# Patient Record
Sex: Female | Born: 1965
Health system: Southern US, Community
[De-identification: ages and names within clinical notes are randomized; demographics above are authoritative.]

---

## 2013-04-07 DIAGNOSIS — G4700 Insomnia, unspecified: Secondary | ICD-10-CM | POA: Insufficient documentation

## 2013-04-07 DIAGNOSIS — E890 Postprocedural hypothyroidism: Secondary | ICD-10-CM | POA: Insufficient documentation

## 2013-04-07 DIAGNOSIS — F32A Depression, unspecified: Secondary | ICD-10-CM | POA: Insufficient documentation

## 2013-04-07 DIAGNOSIS — F329 Major depressive disorder, single episode, unspecified: Secondary | ICD-10-CM | POA: Insufficient documentation

## 2013-04-07 DIAGNOSIS — L659 Nonscarring hair loss, unspecified: Secondary | ICD-10-CM | POA: Insufficient documentation

## 2013-07-18 ENCOUNTER — Ambulatory Visit: Payer: BC Managed Care – PPO | Attending: Family Medicine | Admitting: Physical Therapy

## 2014-07-18 ENCOUNTER — Other Ambulatory Visit: Payer: Self-pay

## 2014-07-18 DIAGNOSIS — Z1231 Encounter for screening mammogram for malignant neoplasm of breast: Secondary | ICD-10-CM

## 2014-08-17 ENCOUNTER — Ambulatory Visit
Admission: RE | Admit: 2014-08-17 | Discharge: 2014-08-17 | Disposition: A | Discharge status: Home / Self Care | Payer: BLUE CROSS/BLUE SHIELD | Source: Ambulatory Visit

## 2014-08-17 DIAGNOSIS — Z1231 Encounter for screening mammogram for malignant neoplasm of breast: Secondary | ICD-10-CM

## 2015-09-04 ENCOUNTER — Ambulatory Visit (HOSPITAL_COMMUNITY): Payer: 59 | Admitting: Physical Therapy

## 2016-03-20 DIAGNOSIS — Z01419 Encounter for gynecological examination (general) (routine) without abnormal findings: Secondary | ICD-10-CM | POA: Diagnosis not present

## 2016-08-13 DIAGNOSIS — M545 Low back pain: Secondary | ICD-10-CM | POA: Diagnosis not present

## 2016-08-25 DIAGNOSIS — Z1231 Encounter for screening mammogram for malignant neoplasm of breast: Secondary | ICD-10-CM | POA: Diagnosis not present

## 2016-12-05 DIAGNOSIS — F5101 Primary insomnia: Secondary | ICD-10-CM | POA: Diagnosis not present

## 2016-12-05 DIAGNOSIS — E890 Postprocedural hypothyroidism: Secondary | ICD-10-CM | POA: Diagnosis not present

## 2016-12-10 DIAGNOSIS — E890 Postprocedural hypothyroidism: Secondary | ICD-10-CM | POA: Diagnosis not present

## 2016-12-25 DIAGNOSIS — E890 Postprocedural hypothyroidism: Secondary | ICD-10-CM | POA: Diagnosis not present

## 2016-12-25 DIAGNOSIS — L659 Nonscarring hair loss, unspecified: Secondary | ICD-10-CM | POA: Diagnosis not present

## 2017-01-22 DIAGNOSIS — Z23 Encounter for immunization: Secondary | ICD-10-CM | POA: Diagnosis not present

## 2017-06-23 DIAGNOSIS — Z0000 Encounter for general adult medical examination without abnormal findings: Secondary | ICD-10-CM | POA: Diagnosis not present

## 2017-06-23 DIAGNOSIS — Z13220 Encounter for screening for lipoid disorders: Secondary | ICD-10-CM | POA: Diagnosis not present

## 2017-07-22 DIAGNOSIS — L80 Vitiligo: Secondary | ICD-10-CM | POA: Diagnosis not present

## 2017-09-16 DIAGNOSIS — Z23 Encounter for immunization: Secondary | ICD-10-CM | POA: Diagnosis not present

## 2017-09-16 DIAGNOSIS — F5101 Primary insomnia: Secondary | ICD-10-CM | POA: Diagnosis not present

## 2017-09-23 DIAGNOSIS — Z1231 Encounter for screening mammogram for malignant neoplasm of breast: Secondary | ICD-10-CM | POA: Diagnosis not present

## 2017-09-29 DIAGNOSIS — L80 Vitiligo: Secondary | ICD-10-CM | POA: Diagnosis not present

## 2017-09-29 DIAGNOSIS — L811 Chloasma: Secondary | ICD-10-CM | POA: Diagnosis not present

## 2017-09-29 DIAGNOSIS — L658 Other specified nonscarring hair loss: Secondary | ICD-10-CM | POA: Diagnosis not present

## 2017-11-23 DIAGNOSIS — M2042 Other hammer toe(s) (acquired), left foot: Secondary | ICD-10-CM | POA: Diagnosis not present

## 2017-11-23 DIAGNOSIS — M79671 Pain in right foot: Secondary | ICD-10-CM | POA: Diagnosis not present

## 2017-11-23 DIAGNOSIS — M79672 Pain in left foot: Secondary | ICD-10-CM | POA: Diagnosis not present

## 2017-12-11 DIAGNOSIS — L659 Nonscarring hair loss, unspecified: Secondary | ICD-10-CM | POA: Diagnosis not present

## 2017-12-11 DIAGNOSIS — E890 Postprocedural hypothyroidism: Secondary | ICD-10-CM | POA: Diagnosis not present

## 2017-12-18 DIAGNOSIS — E890 Postprocedural hypothyroidism: Secondary | ICD-10-CM | POA: Diagnosis not present

## 2017-12-18 DIAGNOSIS — E221 Hyperprolactinemia: Secondary | ICD-10-CM | POA: Diagnosis not present

## 2017-12-18 DIAGNOSIS — L659 Nonscarring hair loss, unspecified: Secondary | ICD-10-CM | POA: Diagnosis not present

## 2017-12-22 ENCOUNTER — Other Ambulatory Visit: Payer: Self-pay | Admitting: Endocrinology

## 2017-12-22 DIAGNOSIS — E221 Hyperprolactinemia: Secondary | ICD-10-CM

## 2018-01-05 ENCOUNTER — Ambulatory Visit
Admission: RE | Admit: 2018-01-05 | Discharge: 2018-01-05 | Disposition: A | Discharge status: Home / Self Care | Payer: BLUE CROSS/BLUE SHIELD | Source: Ambulatory Visit | Attending: Endocrinology | Admitting: Endocrinology

## 2018-01-05 DIAGNOSIS — E221 Hyperprolactinemia: Secondary | ICD-10-CM | POA: Diagnosis not present

## 2018-01-05 DIAGNOSIS — L80 Vitiligo: Secondary | ICD-10-CM | POA: Diagnosis not present

## 2018-01-05 MED ORDER — GADOBENATE DIMEGLUMINE 529 MG/ML IV SOLN
9.0000 mL | Freq: Once | INTRAVENOUS | Status: AC | PRN
  Administered 2018-01-05: 9 mL via INTRAVENOUS

## 2018-01-21 DIAGNOSIS — Z01419 Encounter for gynecological examination (general) (routine) without abnormal findings: Secondary | ICD-10-CM | POA: Diagnosis not present

## 2018-01-21 DIAGNOSIS — Z6828 Body mass index (BMI) 28.0-28.9, adult: Secondary | ICD-10-CM | POA: Diagnosis not present

## 2018-03-06 ENCOUNTER — Ambulatory Visit (INDEPENDENT_AMBULATORY_CARE_PROVIDER_SITE_OTHER): Payer: 59

## 2018-03-06 ENCOUNTER — Ambulatory Visit (INDEPENDENT_AMBULATORY_CARE_PROVIDER_SITE_OTHER): Payer: 59 | Admitting: Podiatry

## 2018-03-06 ENCOUNTER — Encounter: Payer: Self-pay | Admitting: Podiatry

## 2018-03-06 DIAGNOSIS — M2042 Other hammer toe(s) (acquired), left foot: Secondary | ICD-10-CM | POA: Diagnosis not present

## 2018-03-06 DIAGNOSIS — M2041 Other hammer toe(s) (acquired), right foot: Secondary | ICD-10-CM | POA: Diagnosis not present

## 2018-03-09 DIAGNOSIS — E221 Hyperprolactinemia: Secondary | ICD-10-CM | POA: Diagnosis not present

## 2018-03-13 NOTE — Progress Notes (Signed)
   HPI: 53 year old female presents the office today for evaluation of bilateral foot pain.  Pain is currently 1/10.  Patient states that she did have bilateral foot surgery in 1998 when she was living in [place].  Since then the patient has had constant dull pain and it does become sharp at times with prolonged standing.  She states that her second digits are turning outwards bilaterally R > L.  Patient states that the right foot and toes very painful and symptomatic and starting to get worse over the past year.  Aggravating factors: Working out, wearing certain shoes.  The patient does wrap the toes, he used over-the-counter ibuprofen and Tylenol with minimal relief.  No past medical history on file.   Physical Exam: General: The patient is alert and oriented x3 in no acute distress.  Dermatology: Skin is warm, dry and supple bilateral lower extremities. Negative for open lesions or macerations.  Vascular: Palpable pedal pulses bilaterally. No edema or erythema noted. Capillary refill within normal limits.  Neurological: Epicritic and protective threshold grossly intact bilaterally.   Musculoskeletal Exam: Range of motion within normal limits to all pedal and ankle joints bilateral. Muscle strength 5/5 in all groups bilateral.  There is some pain on palpation of the second digits bilaterally.  Radiographic Exam:  Normal osseous mineralization. Joint spaces preserved. No fracture/dislocation/boney destruction.  Orthopedic screws noted to the first and fifth metatarsals bilaterally.  Orthopedic hardware intact and stable with complete healing.  There is some lateral deviation of the second digit at the level of the DIPJ.  Also surgical resection of the fifth metatarsal head of the proximal phalanx noted bilateral.  Assessment: 1.  History of bilateral foot surgery 2.  Symptomatic hammertoe second digit bilateral   Plan of Care:  1. Patient evaluated. X-Rays reviewed.  2.  Today informed the  patient that at the moment I do just recommend simple conservative treatment including shoe gear modifications and over-the-counter ibuprofen as needed.  I do not see any indication or need for surgical intervention at the moment. 3.  Return to clinic as needed      Felecia Shelling, DPM [facility]  Dr. Felecia Shelling, DPM    [address] [place], [place] [address]                Office [phone]  Fax [phone]

## 2020-05-09 IMAGING — MR MR HEAD WO/W CM
19 series · 48 of 48 positions shown · IV contrast (9 ml multihance)
Comparison: None.

CLINICAL DATA: Elevated prolactin

EXAM:
MRI HEAD WITHOUT AND WITH CONTRAST
TECHNIQUE: Multiplanar, multiecho pulse sequences of the brain and surrounding
structures were obtained without and with intravenous contrast.
CONTRAST:  9mL MULTIHANCE GADOBENATE DIMEGLUMINE 529 MG/ML IV SOLN

[Series 5: T1 · sagittal · 4.0mm · 0.72mm/px · 2 of 27 slices shown (1 of 5)]
[im 1/27]
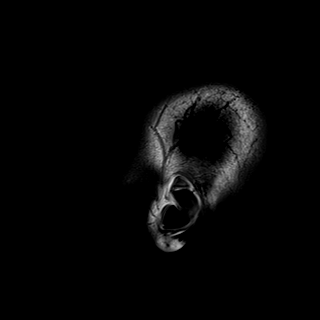
[im 27/27]
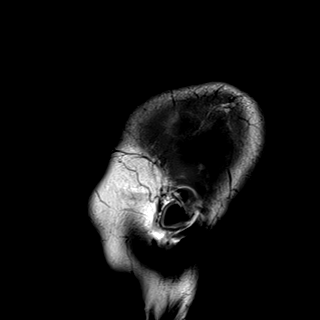

[Series 6: DWI · axial · 3.0mm · 1.44mm/px · z∈[-74,+64]mm · 6 of 74 slices shown (1 of 2)]
[im 1/74]
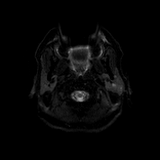
[im 15/74]
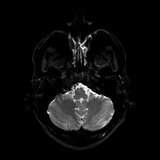
[im 30/74]
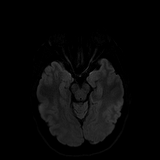
[im 44/74]
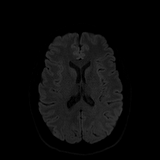
[im 59/74]
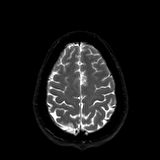
[im 74/74]
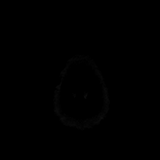

[Series 7: DWI · axial · 3.0mm · 1.44mm/px · z∈[-74,+64]mm · 4 of 37 slices shown (2 of 2)]
[im 1/37]
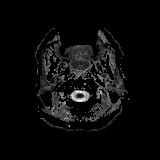
[im 13/37]
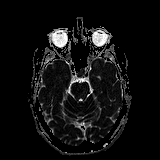
[im 25/37]
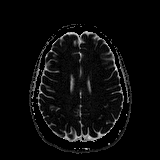
[im 37/37]
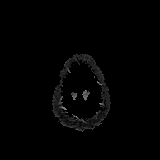

[Series 8: T2 · axial · 4.0mm · 0.36mm/px · z∈[-72,+61]mm · 3 of 27 slices shown]
[im 1/27]
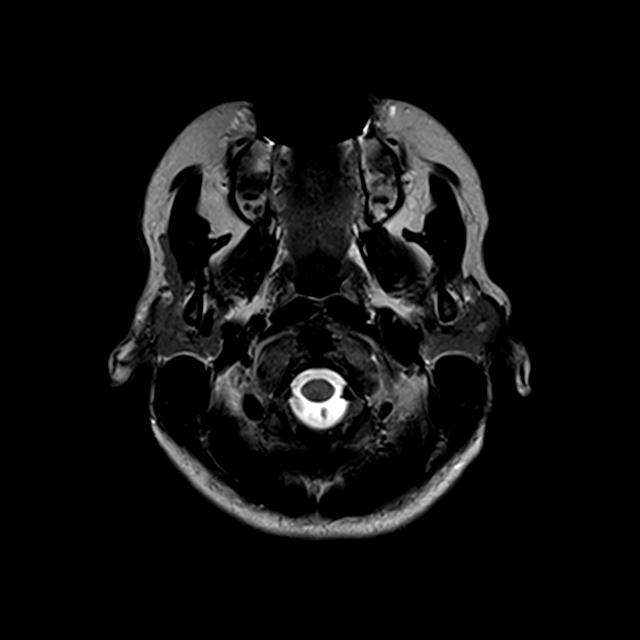
[im 14/27]
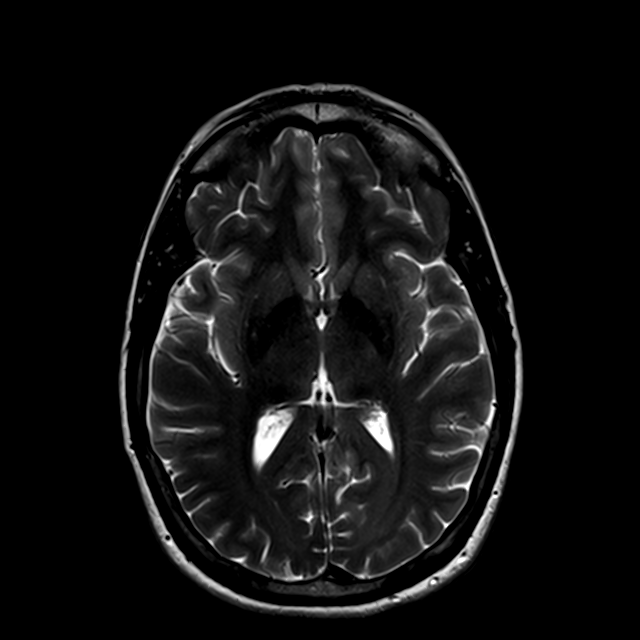
[im 27/27]
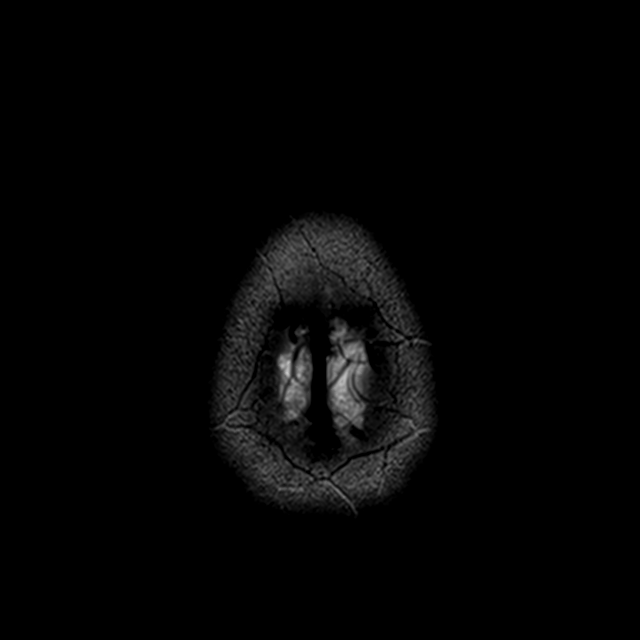

[Series 9: FLAIR · axial · 3.0mm · 0.72mm/px · z∈[-73,+63]mm · 2 of 24 slices shown]
[im 1/24]
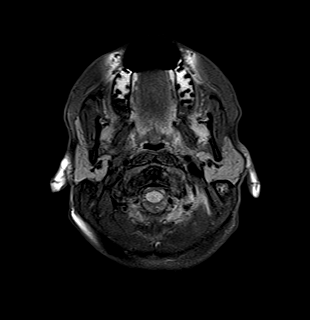
[im 24/24]
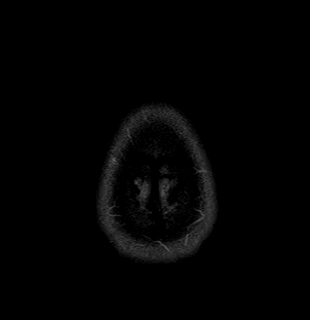

[Series 10: GRE · axial · 4.0mm · 0.69mm/px · z∈[-71,+62]mm · 3 of 27 slices shown]
[im 1/27]
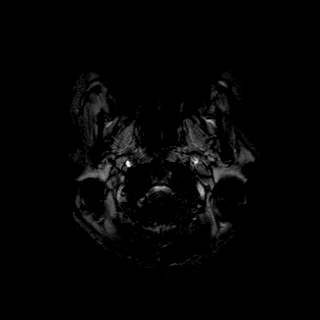
[im 14/27]
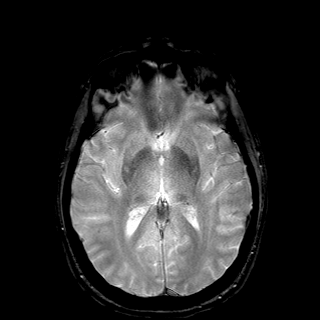
[im 27/27]
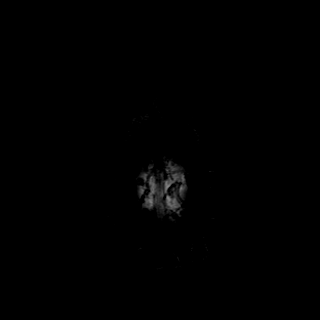

[Series 11: T1 · sagittal · 3.0mm · 0.42mm/px · 1 of 13 slices shown (2 of 5)]
[im 1/13]
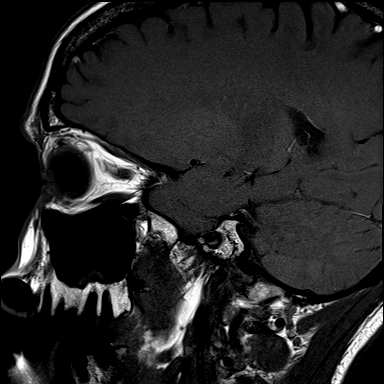

[Series 12: T1 · coronal · non-contrast · 3.0mm · 0.62mm/px · 1 of 9 slices shown (3 of 5)]
[im 1/9]
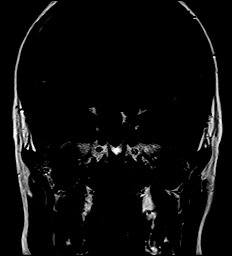

[Series 13: T1 · coronal · 3.0mm · 0.42mm/px · 1 of 10 slices shown (4 of 5)]
[im 1/10]
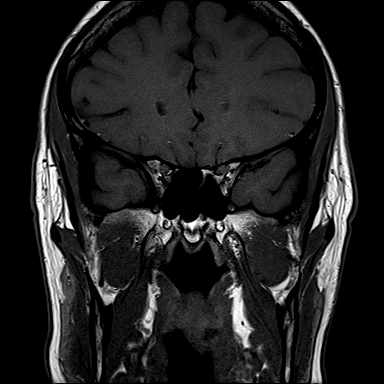

[Series 14: cor post dyn · coronal · 3.0mm · 0.62mm/px · 1 of 9 slices shown (1 of 6)]
[im 1/9]
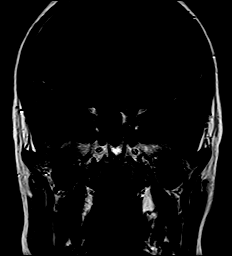

[Series 15: cor post dyn · coronal · 3.0mm · 0.62mm/px · 1 of 9 slices shown (2 of 6)]
[im 1/9]
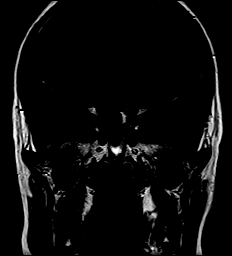

[Series 16: cor post dyn · coronal · 3.0mm · 0.62mm/px · 1 of 9 slices shown (3 of 6)]
[im 1/9]
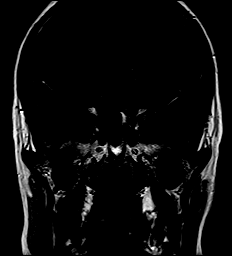

[Series 17: cor post dyn · coronal · 3.0mm · 0.62mm/px · 1 of 9 slices shown (4 of 6)]
[im 1/9]
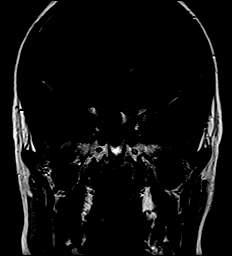

[Series 18: cor post dyn · coronal · 3.0mm · 0.62mm/px · 1 of 9 slices shown (5 of 6)]
[im 1/9]
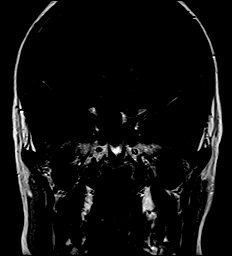

[Series 19: cor post dyn · coronal · 3.0mm · 0.62mm/px · 1 of 9 slices shown (6 of 6)]
[im 1/9]
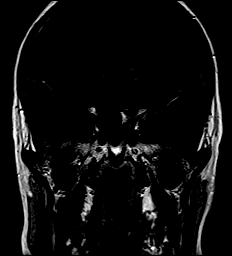

[Series 20: T1 post-contrast · sagittal · 3.0mm · 0.42mm/px · 1 of 13 slices shown (1 of 3)]
[im 1/13]
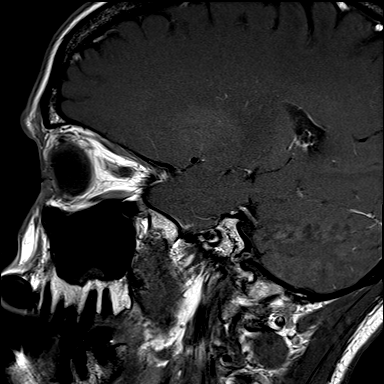

[Series 21: T1 post-contrast · coronal · 3.0mm · 0.42mm/px · 1 of 10 slices shown (2 of 3)]
[im 1/10]
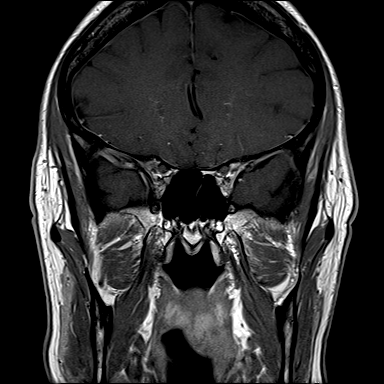

[Series 22: T1 · axial · 1.0mm · 0.86mm/px · z∈[-75,+66]mm · 14 of 144 slices shown (5 of 5)]
[im 1/144]
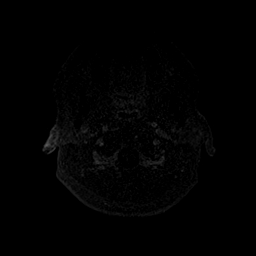
[im 12/144]
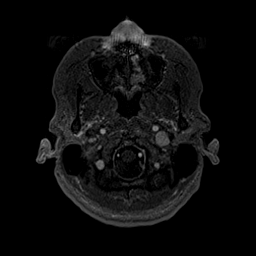
[im 23/144]
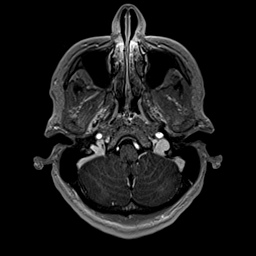
[im 34/144]
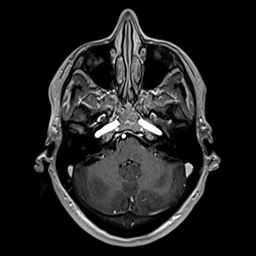
[im 45/144]
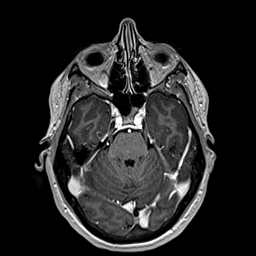
[im 56/144]
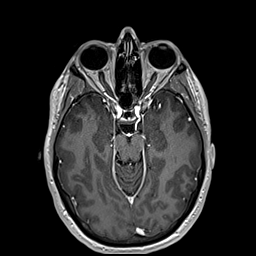
[im 67/144]
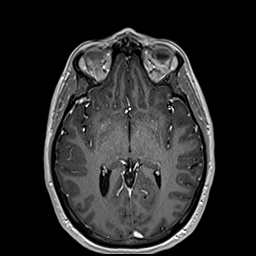
[im 78/144]
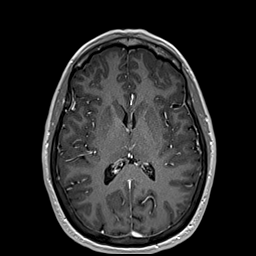
[im 89/144]
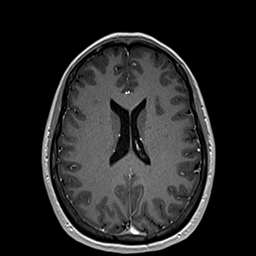
[im 100/144]
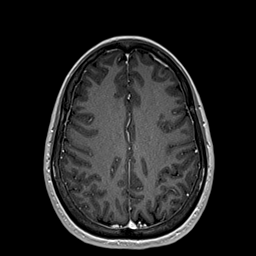
[im 111/144]
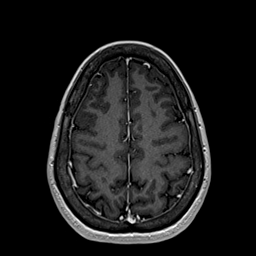
[im 122/144]
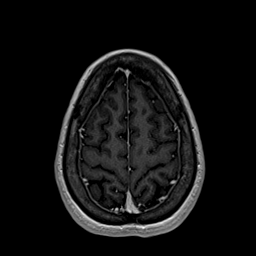
[im 133/144]
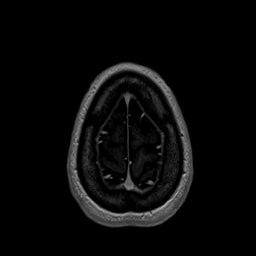
[im 144/144]
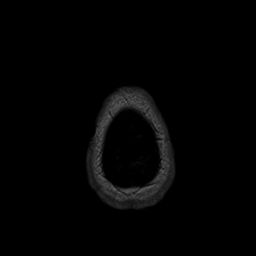

[Series 23: T1 post-contrast · coronal · 4.0mm · 0.47mm/px · 3 of 34 slices shown (3 of 3)]
[im 1/34]
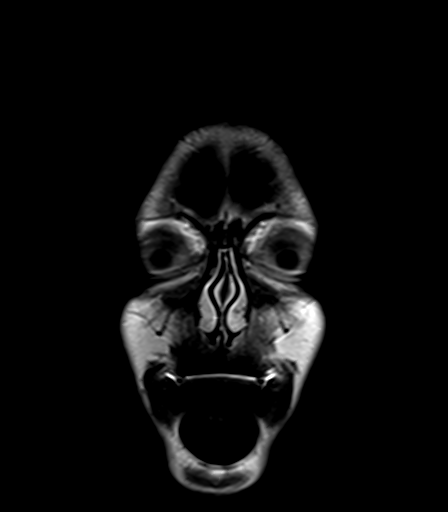
[im 17/34]
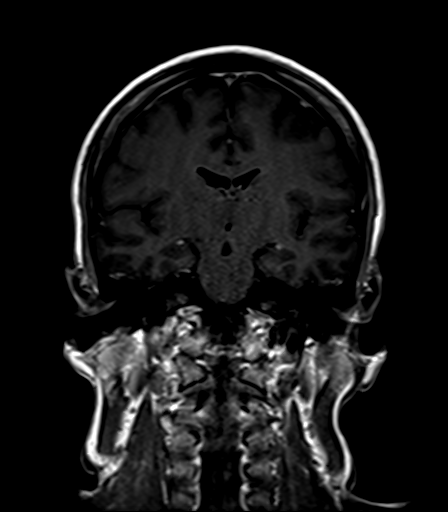
[im 34/34]
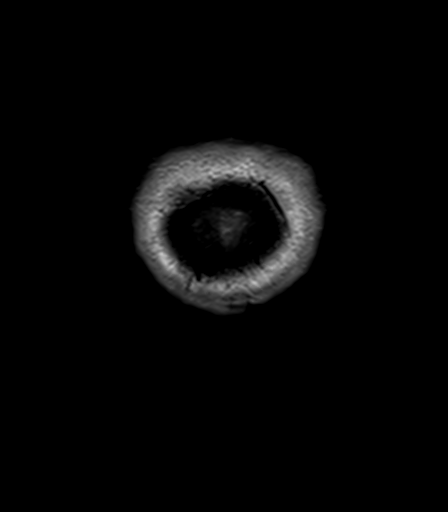

[48 of 48 positions shown; findings below may reference images not displayed]

FINDINGS: Brain: Normal size and morphology pituitary gland with homogeneous
enhancement. Normal infundibulum, cavernous sinus region, and
suprasellar cistern. The brain shows no signal abnormality or volume
loss. No hydrocephalus or collection.

Vascular: Major flow voids and vascular enhancements are preserved

Skull and upper cervical spine: Negative for marrow lesion

Sinuses/Orbits: Negative
IMPRESSION: Normal MRI of the pituitary and brain.

## 2022-10-03 ENCOUNTER — Encounter (INDEPENDENT_AMBULATORY_CARE_PROVIDER_SITE_OTHER): Payer: Self-pay | Admitting: *Deleted

## 2023-04-03 ENCOUNTER — Encounter (INDEPENDENT_AMBULATORY_CARE_PROVIDER_SITE_OTHER): Payer: Self-pay | Admitting: *Deleted

## 2023-07-30 ENCOUNTER — Other Ambulatory Visit: Payer: Self-pay | Admitting: Medical Genetics

## 2023-08-10 ENCOUNTER — Other Ambulatory Visit (HOSPITAL_BASED_OUTPATIENT_CLINIC_OR_DEPARTMENT_OTHER): Payer: Self-pay | Admitting: Endocrinology

## 2023-08-10 DIAGNOSIS — E785 Hyperlipidemia, unspecified: Secondary | ICD-10-CM

## 2023-08-26 ENCOUNTER — Other Ambulatory Visit (HOSPITAL_BASED_OUTPATIENT_CLINIC_OR_DEPARTMENT_OTHER)

## 2023-08-27 ENCOUNTER — Ambulatory Visit (HOSPITAL_BASED_OUTPATIENT_CLINIC_OR_DEPARTMENT_OTHER)
Admission: RE | Admit: 2023-08-27 | Discharge: 2023-08-27 | Disposition: A | Discharge status: Home / Self Care | Payer: Self-pay | Source: Ambulatory Visit | Attending: Endocrinology | Admitting: Endocrinology

## 2023-08-27 DIAGNOSIS — E785 Hyperlipidemia, unspecified: Secondary | ICD-10-CM | POA: Insufficient documentation

## 2023-11-06 ENCOUNTER — Other Ambulatory Visit: Payer: Self-pay | Admitting: Medical Genetics

## 2023-11-06 DIAGNOSIS — Z006 Encounter for examination for normal comparison and control in clinical research program: Secondary | ICD-10-CM
